# Patient Record
Sex: Female | Born: 1995 | Race: Black or African American | Hispanic: No | Marital: Single | Smoking: Never smoker
Health system: Southern US, Community
[De-identification: ages and names within clinical notes are randomized; demographics above are authoritative.]

## PROBLEM LIST (undated history)

## (undated) HISTORY — PX: ANKLE SURGERY: SHX546

---

## 2018-07-27 ENCOUNTER — Emergency Department (HOSPITAL_COMMUNITY)
Admission: EM | Admit: 2018-07-27 | Discharge: 2018-07-28 | Disposition: A | Discharge status: Home / Self Care | Payer: BLUE CROSS/BLUE SHIELD | Attending: Emergency Medicine | Admitting: Emergency Medicine

## 2018-07-27 ENCOUNTER — Other Ambulatory Visit: Payer: Self-pay

## 2018-07-27 ENCOUNTER — Encounter (HOSPITAL_COMMUNITY): Payer: Self-pay | Admitting: Emergency Medicine

## 2018-07-27 DIAGNOSIS — R1013 Epigastric pain: Secondary | ICD-10-CM | POA: Diagnosis present

## 2018-07-27 LAB — COMPREHENSIVE METABOLIC PANEL
ALBUMIN: 4 g/dL (ref 3.5–5.0)
ALT: 15 U/L (ref 0–44)
AST: 20 U/L (ref 15–41)
Alkaline Phosphatase: 46 U/L (ref 38–126)
Anion gap: 6 (ref 5–15)
BUN: 12 mg/dL (ref 6–20)
CALCIUM: 9.1 mg/dL (ref 8.9–10.3)
CHLORIDE: 107 mmol/L (ref 98–111)
CO2: 26 mmol/L (ref 22–32)
CREATININE: 0.85 mg/dL (ref 0.44–1.00)
GFR calc Af Amer: >60 mL/min (ref >60)
GFR calc non Af Amer: >60 mL/min (ref >60)
GLUCOSE: 99 mg/dL (ref 70–99)
Potassium: 3.9 mmol/L (ref 3.5–5.1)
SODIUM: 139 mmol/L (ref 135–145)
Total Bilirubin: 0.6 mg/dL (ref 0.3–1.2)
Total Protein: 6.8 g/dL (ref 6.5–8.1)

## 2018-07-27 LAB — CBC
HCT: 33.7 % — ABNORMAL LOW (ref 36.0–46.0)
Hemoglobin: 10.7 g/dL — ABNORMAL LOW (ref 12.0–15.0)
MCH: 28.6 pg (ref 26.0–34.0)
MCHC: 31.8 g/dL (ref 30.0–36.0)
MCV: 90.1 fL (ref 78.0–100.0)
Platelets: 211 10*3/uL (ref 150–400)
RBC: 3.74 MIL/uL — ABNORMAL LOW (ref 3.87–5.11)
RDW: 13 % (ref 11.5–15.5)
WBC: 6.6 10*3/uL (ref 4.0–10.5)

## 2018-07-27 LAB — I-STAT BETA HCG BLOOD, ED (MC, WL, AP ONLY): I-stat hCG, quantitative: <5 m[IU]/mL (ref <5)

## 2018-07-27 LAB — URINALYSIS, ROUTINE W REFLEX MICROSCOPIC
Bilirubin Urine: NEGATIVE
GLUCOSE, UA: NEGATIVE mg/dL
HGB URINE DIPSTICK: NEGATIVE
Ketones, ur: NEGATIVE mg/dL
Leukocytes, UA: NEGATIVE
Nitrite: NEGATIVE
Protein, ur: NEGATIVE mg/dL
Specific Gravity, Urine: 1.014 (ref 1.005–1.030)
pH: 5 (ref 5.0–8.0)

## 2018-07-27 LAB — LIPASE, BLOOD: LIPASE: 44 U/L (ref 11–51)

## 2018-07-27 NOTE — ED Triage Notes (Signed)
Patient reports intermittent mid/upper abdominal pain onset last month , denies emesis , diarrhea or fever , pain worsened last night .

## 2018-07-28 ENCOUNTER — Emergency Department (HOSPITAL_COMMUNITY): Payer: BLUE CROSS/BLUE SHIELD

## 2018-07-28 MED ORDER — GI COCKTAIL ~~LOC~~
30.0000 mL | Freq: Once | ORAL | Status: AC
  Administered 2018-07-28: 30 mL via ORAL
  Filled 2018-07-28: qty 30

## 2018-07-28 MED ORDER — PANTOPRAZOLE SODIUM 20 MG PO TBEC: 20.0000 mg | DELAYED_RELEASE_TABLET | Freq: Every day | ORAL | 1 refills | Status: AC

## 2018-07-28 MED ORDER — PANTOPRAZOLE SODIUM 40 MG PO TBEC
40.0000 mg | DELAYED_RELEASE_TABLET | Freq: Once | ORAL | Status: AC
  Administered 2018-07-28: 40 mg via ORAL
  Filled 2018-07-28: qty 1

## 2018-07-28 MED ORDER — DICYCLOMINE HCL 10 MG/ML IM SOLN
20.0000 mg | Freq: Once | INTRAMUSCULAR | Status: AC
  Administered 2018-07-28: 20 mg via INTRAMUSCULAR
  Filled 2018-07-28: qty 2

## 2018-07-28 MED ORDER — DICYCLOMINE HCL 20 MG PO TABS: 20.0000 mg | ORAL_TABLET | Freq: Two times a day (BID) | ORAL | 0 refills | Status: AC | PRN

## 2018-07-28 NOTE — Discharge Instructions (Signed)
Your work up in the ED was reassuring. We advise use of Protonix daily. You may take Bentyl as needed for acute episodes of ongoing pain. Avoid spicy foods, citrus fruits, chocolate, coffee as these may irritate your pain. Follow up with your GI doctor for further evaluation of symptoms.

## 2018-07-28 NOTE — ED Notes (Signed)
PT states understanding of care given, follow up care, and medication prescribed. PT ambulated from ED to car with a steady gait. 

## 2018-07-28 NOTE — ED Provider Notes (Signed)
[facility] EMERGENCY DEPARTMENT Provider Note   CSN: [id number] Arrival date & time: 07/27/18  2147     History   Chief Complaint Chief Complaint  Patient presents with  . Abdominal Pain    HPI Joanne Montgomery is a 22 y.o. female.   22 year old female presents to the emergency department for evaluation of abdominal pain.  She reports sharp, intermittent epigastric abdominal pain which has been nonradiating.  It began at the beginning of August.  It is without inciting factors, per patient.  Did start tonight shortly after eating.  The patient has tried Tylenol and Aleve for symptoms without relief.  Denies any associated vomiting, diarrhea, melena, hematochezia, fevers, dysuria, hematuria.  No history of abdominal surgeries.  Was followed by gastroenterology in the past with multiple CT scans and ultrasounds for elevated LFTs.  This was later found to be due to use of a specific birth control.  Patient denies frequent use of NSAIDs.  She is visiting the area from [place], [place].     History reviewed. No pertinent past medical history.  There are no active problems to display for this patient.   Past Surgical History:  Procedure Laterality Date  . ANKLE SURGERY       OB History   None      Home Medications    Prior to Admission medications   Medication Sig Start Date End Date Taking? Authorizing Provider  dicyclomine (BENTYL) 20 MG tablet Take 1 tablet (20 mg total) by mouth every 12 (twelve) hours as needed (for abdominal pain/cramping). 07/28/18   Antony Madura, PA-C  pantoprazole (PROTONIX) 20 MG tablet Take 1 tablet (20 mg total) by mouth daily. 07/28/18   Antony Madura, PA-C    Family History No family history on file.  Social History Social History   Tobacco Use  . Smoking status: Never Smoker  . Smokeless tobacco: Never Used  Substance Use Topics  . Alcohol use: Yes  . Drug use: Never     Allergies   Codeine; Ketamine; and Morphine  and related   Review of Systems Review of Systems Ten systems reviewed and are negative for acute change, except as noted in the HPI.    Physical Exam Updated Vital Signs BP 113/76   Pulse (!) 58   Temp 98.4 F (36.9 C) (Oral)   Resp 18   LMP 06/22/2018   SpO2 100%   Physical Exam  Constitutional: She is oriented to person, place, and time. She appears well-developed and well-nourished. No distress.  Nontoxic appearing and in no distress  HENT:  Head: Normocephalic and atraumatic.  Eyes: Conjunctivae and EOM are normal. No scleral icterus.  Neck: Normal range of motion.  Cardiovascular: Normal rate, regular rhythm and intact distal pulses.  Pulmonary/Chest: Effort normal. No stridor. No respiratory distress.  Respirations even and unlabored  Abdominal: She exhibits no mass. There is tenderness. There is no guarding.  Mild epigastric and left upper quadrant tenderness.  Abdomen soft without palpable masses or distention.  No peritoneal signs.  Musculoskeletal: Normal range of motion.  Neurological: She is alert and oriented to person, place, and time. She exhibits normal muscle tone. Coordination normal.  Skin: Skin is warm and dry. No rash noted. She is not diaphoretic. No erythema. No pallor.  Psychiatric: She has a normal mood and affect. Her behavior is normal.  Nursing note and vitals reviewed.    ED Treatments / Results  Labs (all labs ordered are listed, but only abnormal  results are displayed) Labs Reviewed  CBC - Abnormal; Notable for the following components:      Result Value   RBC 3.74 (*)    Hemoglobin 10.7 (*)    HCT 33.7 (*)    All other components within normal limits  LIPASE, BLOOD  COMPREHENSIVE METABOLIC PANEL  URINALYSIS, ROUTINE W REFLEX MICROSCOPIC  I-STAT BETA HCG BLOOD, ED ([facility], [facility], [facility] ONLY)    EKG None  Radiology US Abdomen Complete  Result Date: 07/28/2018 CLINICAL DATA:  Intermittent abdominal pain for 1 month in the mid to upper  abdomen. EXAM: ABDOMEN ULTRASOUND COMPLETE COMPARISON:  None. FINDINGS: Gallbladder: Contracted and free of stones. No sonographic Murphy's. Single wall thickness is normal at 2.3 mm. No pericholecystic fluid. Common bile duct: Diameter: 1.1 mm and normal. Liver: No focal lesion identified. Within normal limits in parenchymal echogenicity. Portal vein is patent on color Doppler imaging with normal direction of blood flow towards the liver. IVC: No abnormality visualized. Pancreas: Visualized portion unremarkable. Spleen: Size and appearance within normal limits. Right Kidney: Length: 11 cm. Echogenicity within normal limits. No mass or hydronephrosis visualized. Left Kidney: Length: 11.8 cm. Echogenicity within normal limits. No mass or hydronephrosis visualized. Abdominal aorta: No aneurysm visualized. Other findings: None. IMPRESSION: No sonographic findings for the patient's abdominal pain. Electronically Signed   By: Tollie Eth M.D.   On: 07/28/2018 01:13    Procedures Procedures (including critical care time)  Medications Ordered in ED Medications  gi cocktail (Maalox,Lidocaine,Donnatal) (30 mLs Oral Given 07/28/18 0021)  dicyclomine (BENTYL) injection 20 mg (20 mg Intramuscular Given 07/28/18 0024)  pantoprazole (PROTONIX) EC tablet 40 mg (40 mg Oral Given 07/28/18 0020)    1:46 AM Patient states that pain is improved with medications.  Currently rates pain at 3/10, down from 7/10 on arrival.  Conveyed reassuring ultrasound results.  Patient verbalizes understanding.   Initial Impression / Assessment and Plan / ED Course  I have reviewed the triage vital signs and the nursing notes.  Pertinent labs & imaging results that were available during my care of the patient were reviewed by me and considered in my medical decision making (see chart for details).     22 year old female presents to the emergency department for evaluation of intermittent, ongoing epigastric pain for the past month.   Pain is nonradiating.  No associated fevers, nausea, vomiting.  There is mild focal epigastric tenderness without guarding.  Abdomen without peritoneal signs.  Work-up today is reassuring without leukocytosis or fever.  No electrolyte derangements.  Liver and kidney function preserved.  Lipase is normal.  Urinalysis without evidence of UTI.  There was no significant patient had an ultrasound of the emergency department which did not show any acute or emergent process.    Given chronicity of symptoms and symptomatic improvement over ED course, I believe the patient can follow up further with her PCP and GI doctor.  She has been seen by gastroenterology in the past.  Will provide prescriptions for Protonix and Bentyl.  Return precautions discussed and provided. Patient discharged in stable condition with no unaddressed concerns.   Final Clinical Impressions(s) / ED Diagnoses   Final diagnoses:  Epigastric pain    ED Discharge Orders         Ordered    pantoprazole (PROTONIX) 20 MG tablet  Daily     07/28/18 0143    dicyclomine (BENTYL) 20 MG tablet  Every 12 hours PRN     07/28/18 0143  Antony Madura, PA-C 07/28/18 0151    Derwood Kaplan, MD 07/30/18 [phone]

## 2018-09-25 IMAGING — US US ABDOMEN COMPLETE
1 series · 14 of 25 positions shown · non-contrast
Comparison: None.

CLINICAL DATA: Intermittent abdominal pain for 1 month in the mid
to upper abdomen.

EXAM:
ABDOMEN ULTRASOUND COMPLETE

[Series 1: us abdomen complete · 0.15mm/px · 14 of 81 slices shown]
[im 1/81]
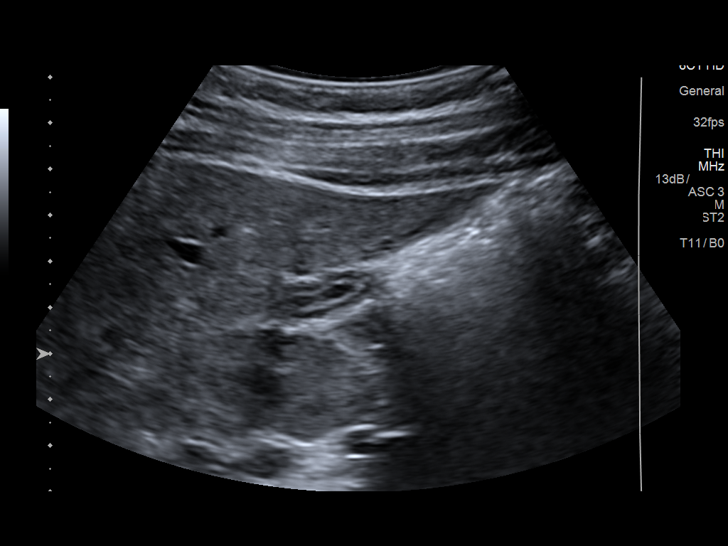
[im 7/81]
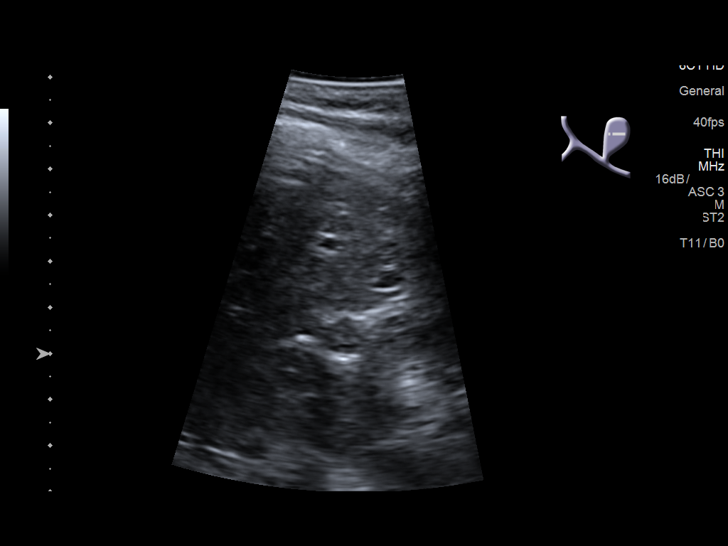
[im 14/81]
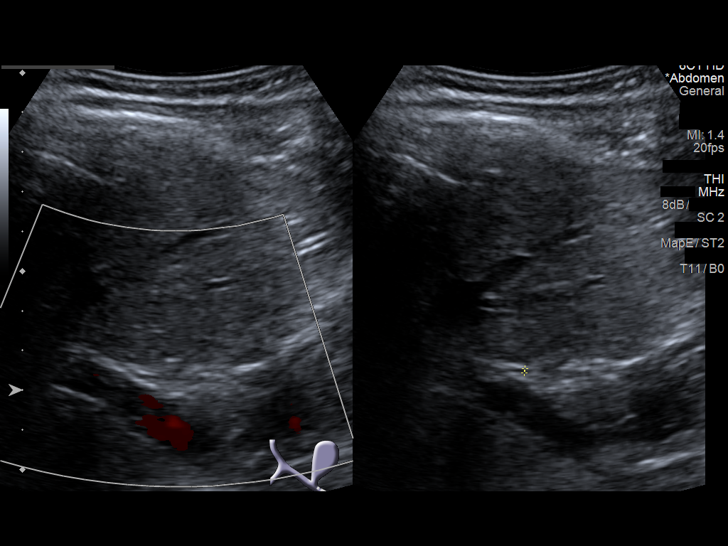
[im 21/81]
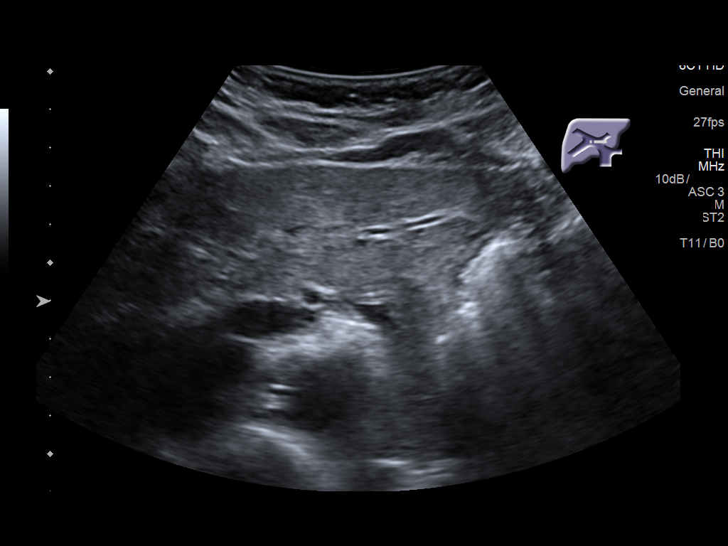
[im 27/81]
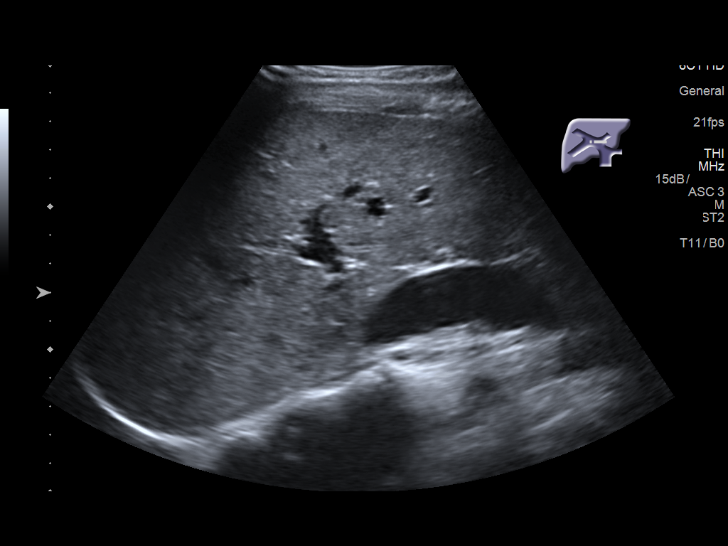
[im 31/81]
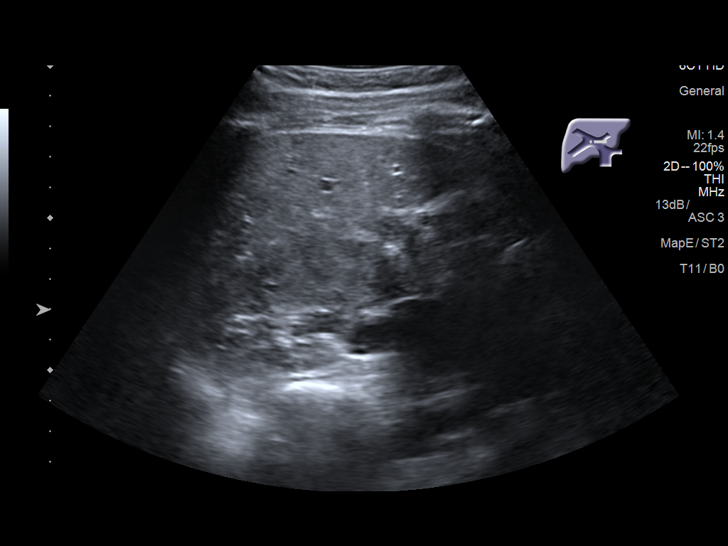
[im 37/81]
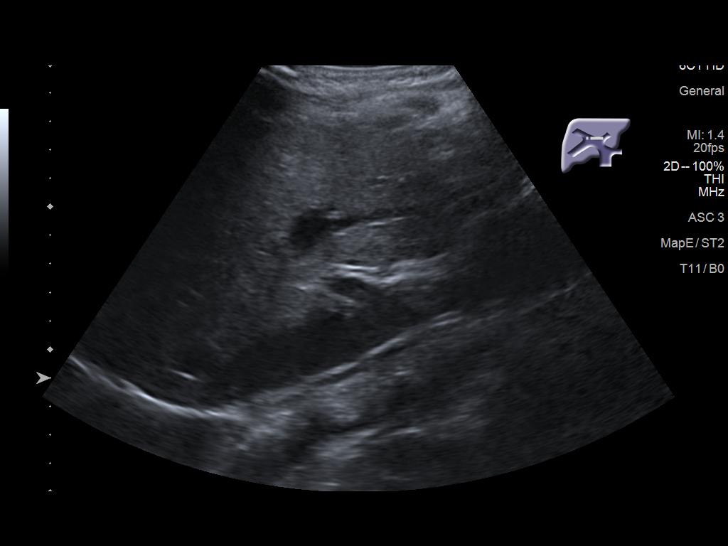
[im 44/81]
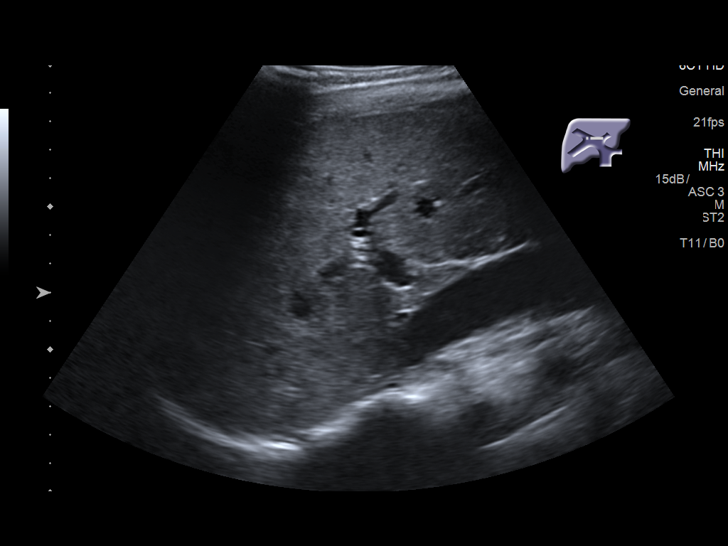
[im 51/81]
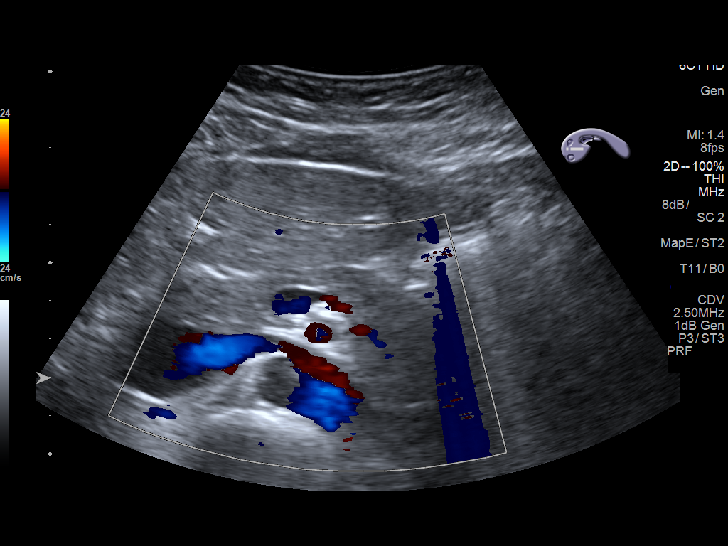
[im 54/81]
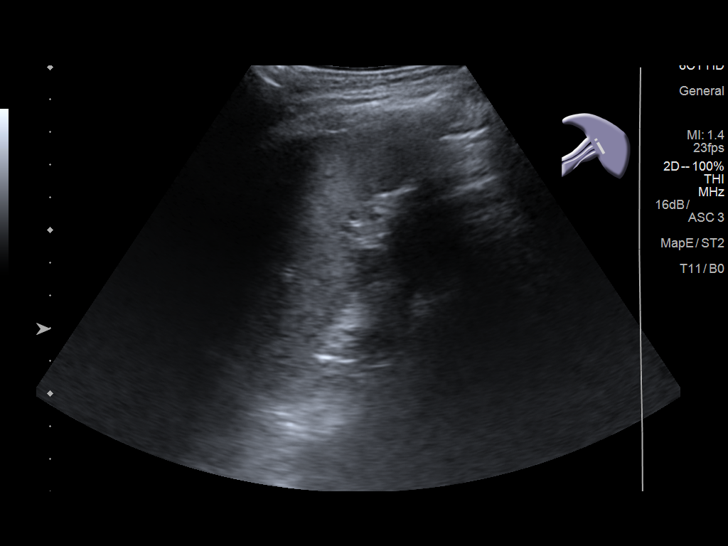
[im 61/81]
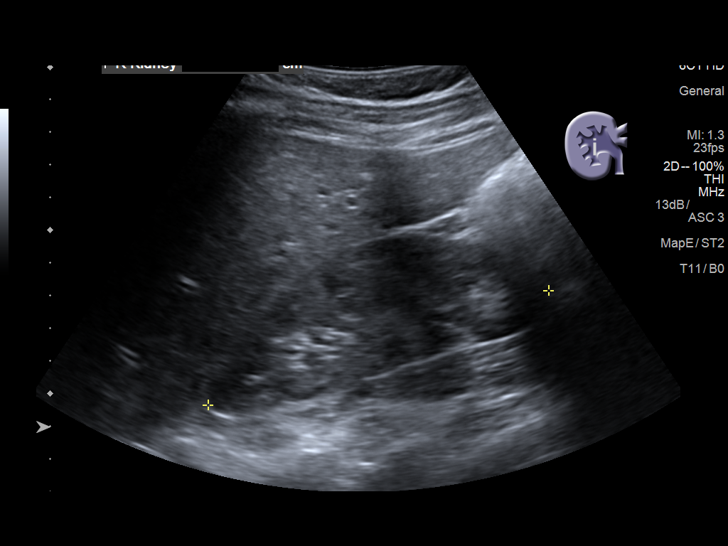
[im 67/81]
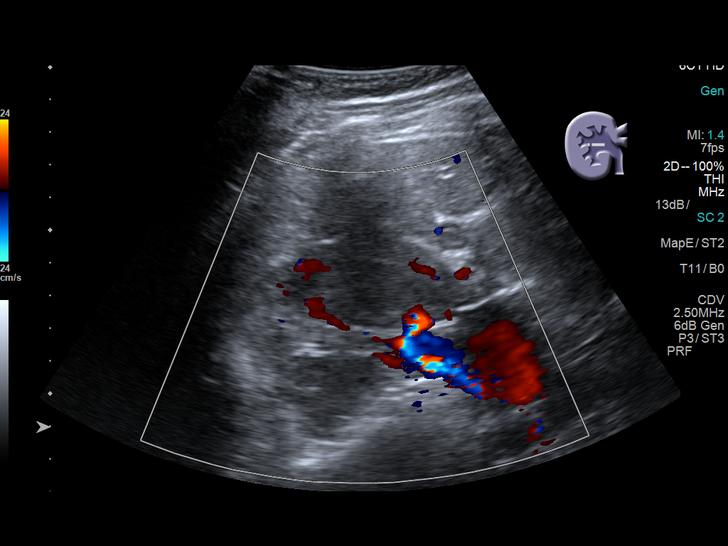
[im 74/81]
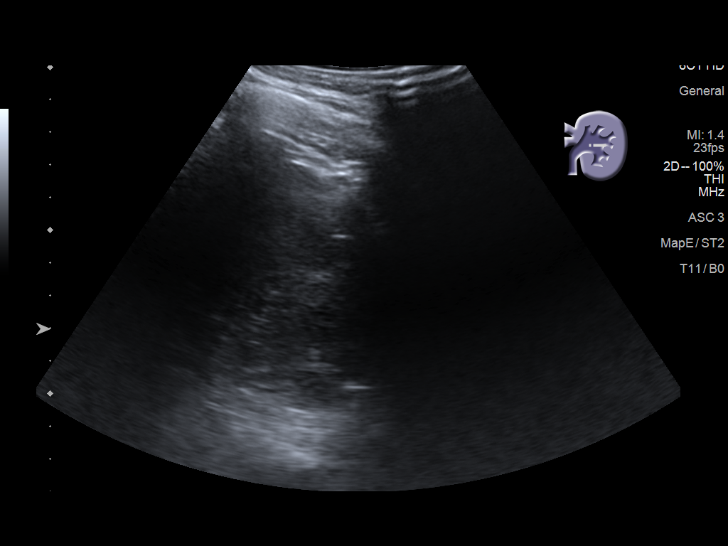
[im 81/81]
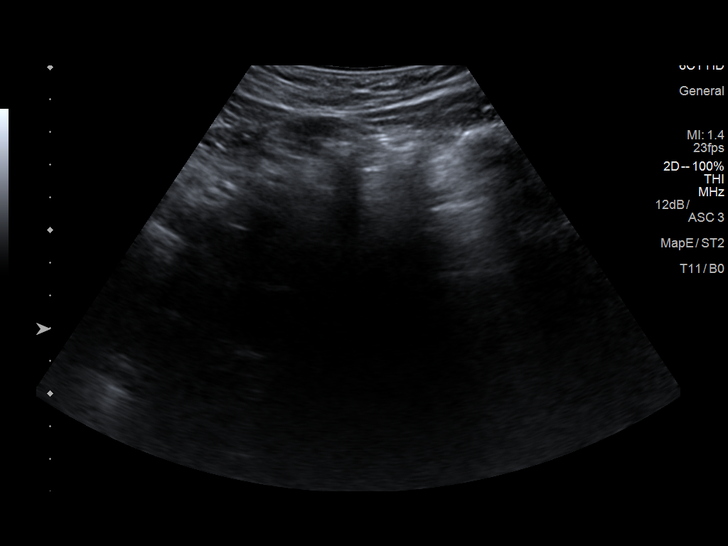

[14 of 25 positions shown; findings below may reference images not displayed]

FINDINGS: Gallbladder: Contracted and free of stones. No sonographic Bloch.
Single wall thickness is normal at 2.3 mm. No pericholecystic fluid.

Common bile duct: Diameter: 1.1 mm and normal.

Liver: No focal lesion identified. Within normal limits in
parenchymal echogenicity. Portal vein is patent on color Doppler
imaging with normal direction of blood flow towards the liver.

IVC: No abnormality visualized.

Pancreas: Visualized portion unremarkable.

Spleen: Size and appearance within normal limits.

Right Kidney: Length: 11 cm. Echogenicity within normal limits. No
mass or hydronephrosis visualized.

Left Kidney: Length: 11.8 cm. Echogenicity within normal limits. No
mass or hydronephrosis visualized.

Abdominal aorta: No aneurysm visualized.

Other findings: None.
IMPRESSION: No sonographic findings for the patient's abdominal pain.
